# Patient Record
Sex: Male | Born: 1975 | Hispanic: Yes | Marital: Married | Smoking: Never smoker
Health system: Southern US, Community
[De-identification: ages and names within clinical notes are randomized; demographics above are authoritative.]

---

## 2014-09-23 ENCOUNTER — Emergency Department: Payer: Self-pay | Admitting: Emergency Medicine

## 2014-09-23 LAB — CBC
HCT: 48.1 % (ref 40.0–52.0)
HGB: 15.8 g/dL (ref 13.0–18.0)
MCH: 28.4 pg (ref 26.0–34.0)
MCHC: 32.8 g/dL (ref 32.0–36.0)
MCV: 87 fL (ref 80–100)
Platelet: 183 10*3/uL (ref 150–440)
RBC: 5.55 10*6/uL (ref 4.40–5.90)
RDW: 13.2 % (ref 11.5–14.5)
WBC: 11.8 10*3/uL — ABNORMAL HIGH (ref 3.8–10.6)

## 2014-09-23 LAB — COMPREHENSIVE METABOLIC PANEL
ALBUMIN: 4.1 g/dL (ref 3.4–5.0)
ALK PHOS: 63 U/L
AST: 41 U/L — AB (ref 15–37)
Anion Gap: 6 — ABNORMAL LOW (ref 7–16)
BILIRUBIN TOTAL: 0.7 mg/dL (ref 0.2–1.0)
BUN: 15 mg/dL (ref 7–18)
CHLORIDE: 105 mmol/L (ref 98–107)
Calcium, Total: 8.8 mg/dL (ref 8.5–10.1)
Co2: 28 mmol/L (ref 21–32)
Creatinine: 0.87 mg/dL (ref 0.60–1.30)
EGFR (African American): >60
EGFR (Non-African Amer.): >60
GLUCOSE: 144 mg/dL — AB (ref 65–99)
Osmolality: 281 (ref 275–301)
POTASSIUM: 3.9 mmol/L (ref 3.5–5.1)
SGPT (ALT): 35 U/L
SODIUM: 139 mmol/L (ref 136–145)
TOTAL PROTEIN: 8 g/dL (ref 6.4–8.2)

## 2014-09-23 LAB — URINALYSIS, COMPLETE
Bacteria: NONE SEEN
Bilirubin,UR: NEGATIVE
GLUCOSE, UR: NEGATIVE mg/dL (ref 0–75)
Ketone: NEGATIVE
LEUKOCYTE ESTERASE: NEGATIVE
NITRITE: NEGATIVE
PROTEIN: NEGATIVE
Ph: 6 (ref 4.5–8.0)
RBC,UR: 122 /HPF (ref 0–5)
Specific Gravity: 1.019 (ref 1.003–1.030)
Squamous Epithelial: NONE SEEN
WBC UR: 2 /HPF (ref 0–5)

## 2019-04-26 ENCOUNTER — Emergency Department: Payer: Self-pay

## 2019-04-26 ENCOUNTER — Emergency Department
Admission: EM | Admit: 2019-04-26 | Discharge: 2019-04-26 | Disposition: A | Discharge status: Home / Self Care | Payer: Self-pay | Attending: Emergency Medicine | Admitting: Emergency Medicine

## 2019-04-26 ENCOUNTER — Other Ambulatory Visit: Payer: Self-pay

## 2019-04-26 ENCOUNTER — Emergency Department: Admission: EM | Admit: 2019-04-26 | Discharge: 2019-04-26 | Discharge status: Absent without leave | Payer: Self-pay

## 2019-04-26 ENCOUNTER — Encounter: Payer: Self-pay | Admitting: Emergency Medicine

## 2019-04-26 DIAGNOSIS — T63311A Toxic effect of venom of black widow spider, accidental (unintentional), initial encounter: Secondary | ICD-10-CM | POA: Insufficient documentation

## 2019-04-26 DIAGNOSIS — M7918 Myalgia, other site: Secondary | ICD-10-CM | POA: Insufficient documentation

## 2019-04-26 LAB — BASIC METABOLIC PANEL
Anion gap: 13 (ref 5–15)
BUN: 15 mg/dL (ref 6–20)
CO2: 23 mmol/L (ref 22–32)
Calcium: 8.9 mg/dL (ref 8.9–10.3)
Chloride: 101 mmol/L (ref 98–111)
Creatinine, Ser: 0.94 mg/dL (ref 0.61–1.24)
GFR calc Af Amer: >60 mL/min (ref >60)
GFR calc non Af Amer: >60 mL/min (ref >60)
Glucose, Bld: 143 mg/dL — ABNORMAL HIGH (ref 70–99)
Potassium: 3.7 mmol/L (ref 3.5–5.1)
Sodium: 137 mmol/L (ref 135–145)

## 2019-04-26 LAB — CBC
HCT: 50.8 % (ref 39.0–52.0)
Hemoglobin: 17.9 g/dL — ABNORMAL HIGH (ref 13.0–17.0)
MCH: 29.3 pg (ref 26.0–34.0)
MCHC: 35.2 g/dL (ref 30.0–36.0)
MCV: 83.1 fL (ref 80.0–100.0)
Platelets: 241 10*3/uL (ref 150–400)
RBC: 6.11 MIL/uL — ABNORMAL HIGH (ref 4.22–5.81)
RDW: 12.9 % (ref 11.5–15.5)
WBC: 12.8 10*3/uL — ABNORMAL HIGH (ref 4.0–10.5)
nRBC: 0 % (ref 0.0–0.2)

## 2019-04-26 LAB — HEPATIC FUNCTION PANEL
ALT: 38 U/L (ref 0–44)
AST: 32 U/L (ref 15–41)
Albumin: 4.5 g/dL (ref 3.5–5.0)
Alkaline Phosphatase: 61 U/L (ref 38–126)
Bilirubin, Direct: <0.1 mg/dL (ref 0.0–0.2)
Total Bilirubin: 0.9 mg/dL (ref 0.3–1.2)
Total Protein: 8 g/dL (ref 6.5–8.1)

## 2019-04-26 LAB — CK: Total CK: 96 U/L (ref 49–397)

## 2019-04-26 LAB — TROPONIN I: Troponin I: <0.03 ng/mL (ref <0.03)

## 2019-04-26 MED ORDER — IBUPROFEN 200 MG PO TABS: 600.0000 mg | ORAL_TABLET | Freq: Four times a day (QID) | ORAL | 0 refills | Status: AC | PRN

## 2019-04-26 MED ORDER — ONDANSETRON 4 MG PO TBDP: 4.0000 mg | ORAL_TABLET | Freq: Three times a day (TID) | ORAL | 0 refills | Status: AC | PRN

## 2019-04-26 MED ORDER — KETOROLAC TROMETHAMINE 60 MG/2ML IM SOLN
15.0000 mg | Freq: Once | INTRAMUSCULAR | Status: AC
  Administered 2019-04-26: 21:00:00 15 mg via INTRAMUSCULAR
  Filled 2019-04-26: qty 2

## 2019-04-26 MED ORDER — ONDANSETRON 4 MG PO TBDP
8.0000 mg | ORAL_TABLET | Freq: Once | ORAL | Status: AC
  Administered 2019-04-26: 8 mg via ORAL
  Filled 2019-04-26: qty 2

## 2019-04-26 NOTE — ED Triage Notes (Addendum)
Hospital interpreter Marchelle Folks with pt; Pt says he was bitten on the back of his right leg by a black widow spider around noon today; pt shows this nurse the area just above his knee; no redness or swelling noted; pt reports some leg pain at this time; now says he's also having pain across the center of his chest and mid back; ambulatory with steady gait; pt adds he's vomited 2 times since the spider bite; denies muscle cramps

## 2019-04-26 NOTE — ED Notes (Signed)
Pt has went to medical imaging.

## 2019-04-26 NOTE — ED Notes (Signed)
Pt stated that he was bite by a spider earlier around noon and then around 1pm he started having pain in his leg, back, abd and across his chest. Pt also stated that he vomited twice earlier but he does feel better now.

## 2019-04-26 NOTE — ED Provider Notes (Signed)
[facility] Emergency Department Provider Note  ____________________________________________  Time seen: Approximately 9:59 PM  I have reviewed the triage vital signs and the nursing notes.   HISTORY  Chief Complaint Insect Bite; Chest Pain; and Back Pain  Encounter completed with Spanish interpreter at bedside  HPI Jesse Glass is a 43 y.o. male with no significant past medical history who reports being bitten by a black widow spider at about 12:00 PM today, and an hour later at 1:00 PM he started having pain in his right posterior leg, low back, abdomen, and diffusely across the chest.  Also reports nausea and vomiting earlier which has resolved.  Denies shortness of breath.  Pain is not radiating, no diaphoresis, no exertional symptoms or pleuritic symptoms.  Denies body aches fevers or chills.  Denies paresthesias or motor weakness.  Symptoms are constant, gradual onset, slowly improving in the last hour.  No aggravating or alleviating factors   Patient brings in a spider in a plastic bag, which is clearly a black widow.   History reviewed. No pertinent past medical history.   There are no active problems to display for this patient.    History reviewed. No pertinent surgical history.   Prior to Admission medications   Medication Sig Start Date End Date Taking? Authorizing Provider  ibuprofen (MOTRIN IB) 200 MG tablet Take 3 tablets (600 mg total) by mouth every 6 (six) hours as needed. 04/26/19   Sharman Cheek, MD  ondansetron (ZOFRAN ODT) 4 MG disintegrating tablet Take 1 tablet (4 mg total) by mouth every 8 (eight) hours as needed for nausea or vomiting. 04/26/19   Sharman Cheek, MD     Allergies Patient has no known allergies.   History reviewed. No pertinent family history.  Social History Social History   Tobacco Use  . Smoking status: Never Smoker  . Smokeless tobacco: Never Used  Substance Use Topics  . Alcohol use:  Never    Frequency: Never  . Drug use: Never    Review of Systems  Constitutional:   No fever or chills.  ENT:   No sore throat. No rhinorrhea. Cardiovascular:   Chest pain as above.  No syncope. Respiratory:   No dyspnea or cough. Gastrointestinal:   Positive as above for abdominal pain with vomiting Musculoskeletal: Musculoskeletal pain as above. All other systems reviewed and are negative except as documented above in ROS and HPI.  ____________________________________________   PHYSICAL EXAM:  VITAL SIGNS: ED Triage Vitals  Enc Vitals Group     BP 04/26/19 1909 (!) 142/83     Pulse Rate 04/26/19 1909 95     Resp 04/26/19 1909 16     Temp 04/26/19 1909 98.7 F (37.1 C)     Temp Source 04/26/19 1909 Oral     SpO2 04/26/19 1909 98 %     Weight 04/26/19 1911 187 lb 6.3 oz (85 kg)     Height --      Head Circumference --      Peak Flow --      Pain Score 04/26/19 1910 8     Pain Loc --      Pain Edu? --      Excl. in GC? --     Vital signs reviewed, nursing assessments reviewed.   Constitutional:   Alert and oriented. Non-toxic appearance. Eyes:   Conjunctivae are normal. EOMI. PERRL. ENT      Head:   Normocephalic and atraumatic.  Nose:   No congestion/rhinnorhea.       Mouth/Throat:   MMM, no pharyngeal erythema. No peritonsillar mass.       Neck:   No meningismus. Full ROM. Hematological/Lymphatic/Immunilogical:   No cervical lymphadenopathy. Cardiovascular:   RRR. Symmetric bilateral radial and DP pulses.  No murmurs. Cap refill less than 2 seconds. Respiratory:   Normal respiratory effort without tachypnea/retractions. Breath sounds are clear and equal bilaterally. No wheezes/rales/rhonchi. Gastrointestinal:   Soft and nontender. Non distended. There is no CVA tenderness.  No rebound, rigidity, or guarding.  Musculoskeletal:   Normal range of motion in all extremities. No joint effusions.  No lower extremity tenderness.  No edema.  No induration swelling  or inflammatory changes in the area of the right posterior distal thigh where the patient reports being bitten.  No rash or identifiable lesion. Neurologic:   Normal speech and language.  Motor grossly intact. No acute focal neurologic deficits are appreciated.  Skin:    Skin is warm, dry and intact. No rash noted.  No petechiae, purpura, or bullae.  ____________________________________________    LABS (pertinent positives/negatives) (all labs ordered are listed, but only abnormal results are displayed) Labs Reviewed  BASIC METABOLIC PANEL - Abnormal; Notable for the following components:      Result Value   Glucose, Bld 143 (*)    All other components within normal limits  CBC - Abnormal; Notable for the following components:   WBC 12.8 (*)    RBC 6.11 (*)    Hemoglobin 17.9 (*)    All other components within normal limits  TROPONIN I  HEPATIC FUNCTION PANEL  CK   ____________________________________________   EKG  Interpreted by me Normal sinus rhythm rate of 91, right axis, normal intervals.  Normal QRS ST segments and T waves.  No right heart strain, no ischemic changes.  ____________________________________________    RADIOLOGY  Dg Chest 2 View  Result Date: 04/26/2019 CLINICAL DATA:  Black widow bite.  Chest pain. EXAM: CHEST - 2 VIEW COMPARISON:  None. FINDINGS: There is elevation the right hemidiaphragm. No pneumothorax. The heart, hila, mediastinum, lungs, and pleura are otherwise unremarkable. IMPRESSION: No active cardiopulmonary disease. Electronically Signed   By: Gerome Sam III M.D   On: 04/26/2019 19:45    ____________________________________________   PROCEDURES Procedures  ____________________________________________    CLINICAL IMPRESSION / ASSESSMENT AND PLAN / ED COURSE  Medications ordered in the ED: Medications  ketorolac (TORADOL) injection 15 mg (15 mg Intramuscular Given 04/26/19 2043)  ondansetron (ZOFRAN-ODT) disintegrating  tablet 8 mg (8 mg Oral Given 04/26/19 2043)    Pertinent labs & imaging results that were available during my care of the patient were reviewed by me and considered in my medical decision making (see chart for details).  Jesse Glass was evaluated in Emergency Department on 04/26/2019 for the symptoms described in the history of present illness. He was evaluated in the context of the global COVID-19 pandemic, which necessitated consideration that the patient might be at risk for infection with the SARS-CoV-2 virus that causes COVID-19. Institutional protocols and algorithms that pertain to the evaluation of patients at risk for COVID-19 are in a state of rapid change based on information released by regulatory bodies including the CDC and federal and state organizations. These policies and algorithms were followed during the patient's care in the ED.   Patient presents with multiple musculoskeletal complaints that started an hour after being bitten by a black widow spider.  Vital signs  are unremarkable, not in distress, no neurologic symptoms.Considering the patient's symptoms, medical history, and physical examination today, I have low suspicion for ACS, PE, TAD, pneumothorax, carditis, mediastinitis, pneumonia, CHF, or sepsis.  Doubt AAA mesenteric ischemia or acute abdomen.  Terms are improving, labs are unremarkable.  Will treat with NSAIDs, recommend follow-up with primary care tomorrow.  Anticipate symptoms will be self-limited and resolve over the next 48 hours.        ____________________________________________   FINAL CLINICAL IMPRESSION(S) / ED DIAGNOSES    Final diagnoses:  Black widow spider bite, accidental or unintentional, initial encounter  Musculoskeletal pain     ED Discharge Orders         Ordered    ibuprofen (MOTRIN IB) 200 MG tablet  Every 6 hours PRN     04/26/19 2159    ondansetron (ZOFRAN ODT) 4 MG disintegrating tablet  Every 8 hours PRN     04/26/19  2159          Portions of this note were generated with dragon dictation software. Dictation errors may occur despite best attempts at proofreading.   Sharman CheekStafford, Larron Armor, MD 04/26/19 2204

## 2019-11-28 IMAGING — CR CHEST - 2 VIEW
2 series · 2 of 2 positions shown · non-contrast
Comparison: None.

CLINICAL DATA: Black widow bite.  Chest pain.

EXAM:
CHEST - 2 VIEW

[chest pa]
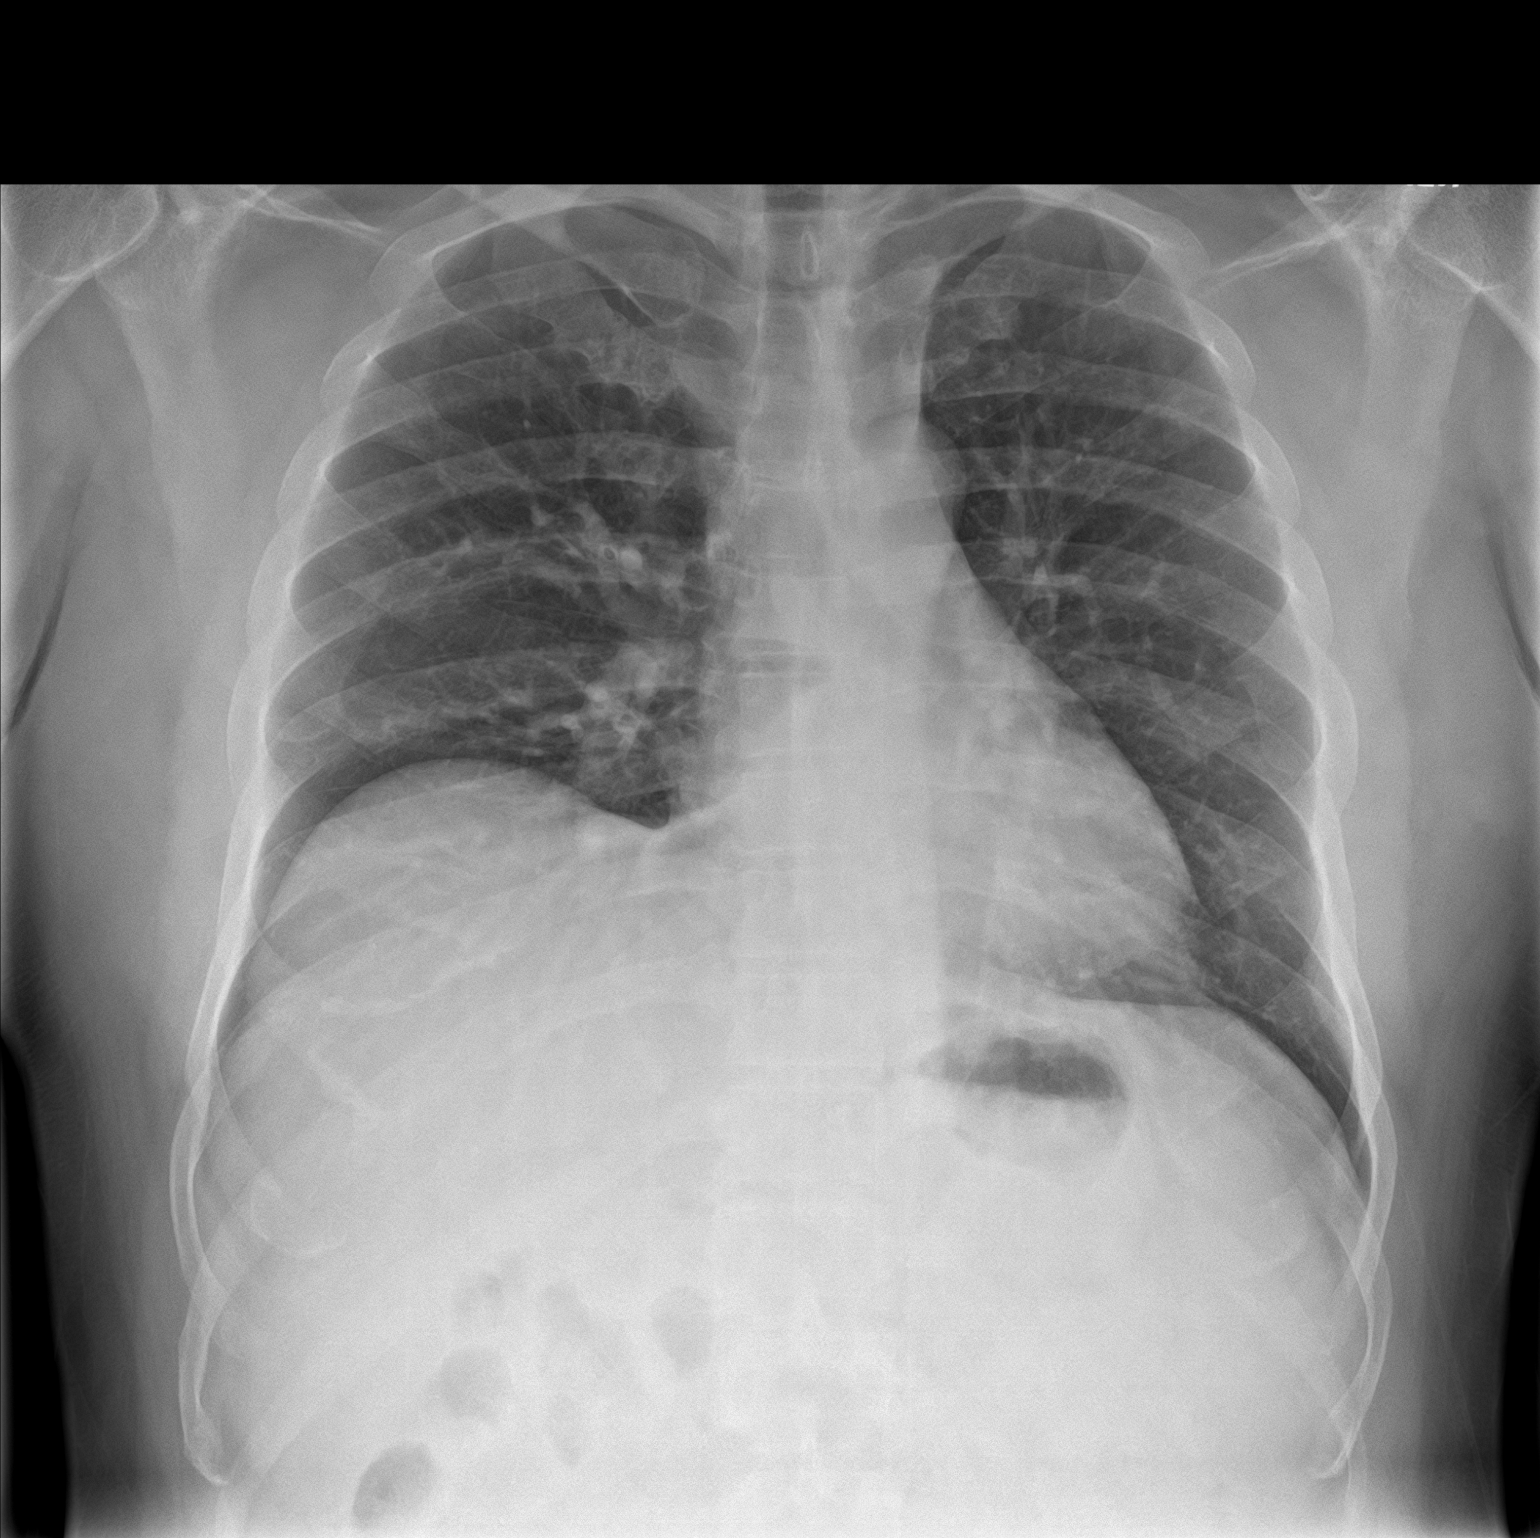

[chest lat]
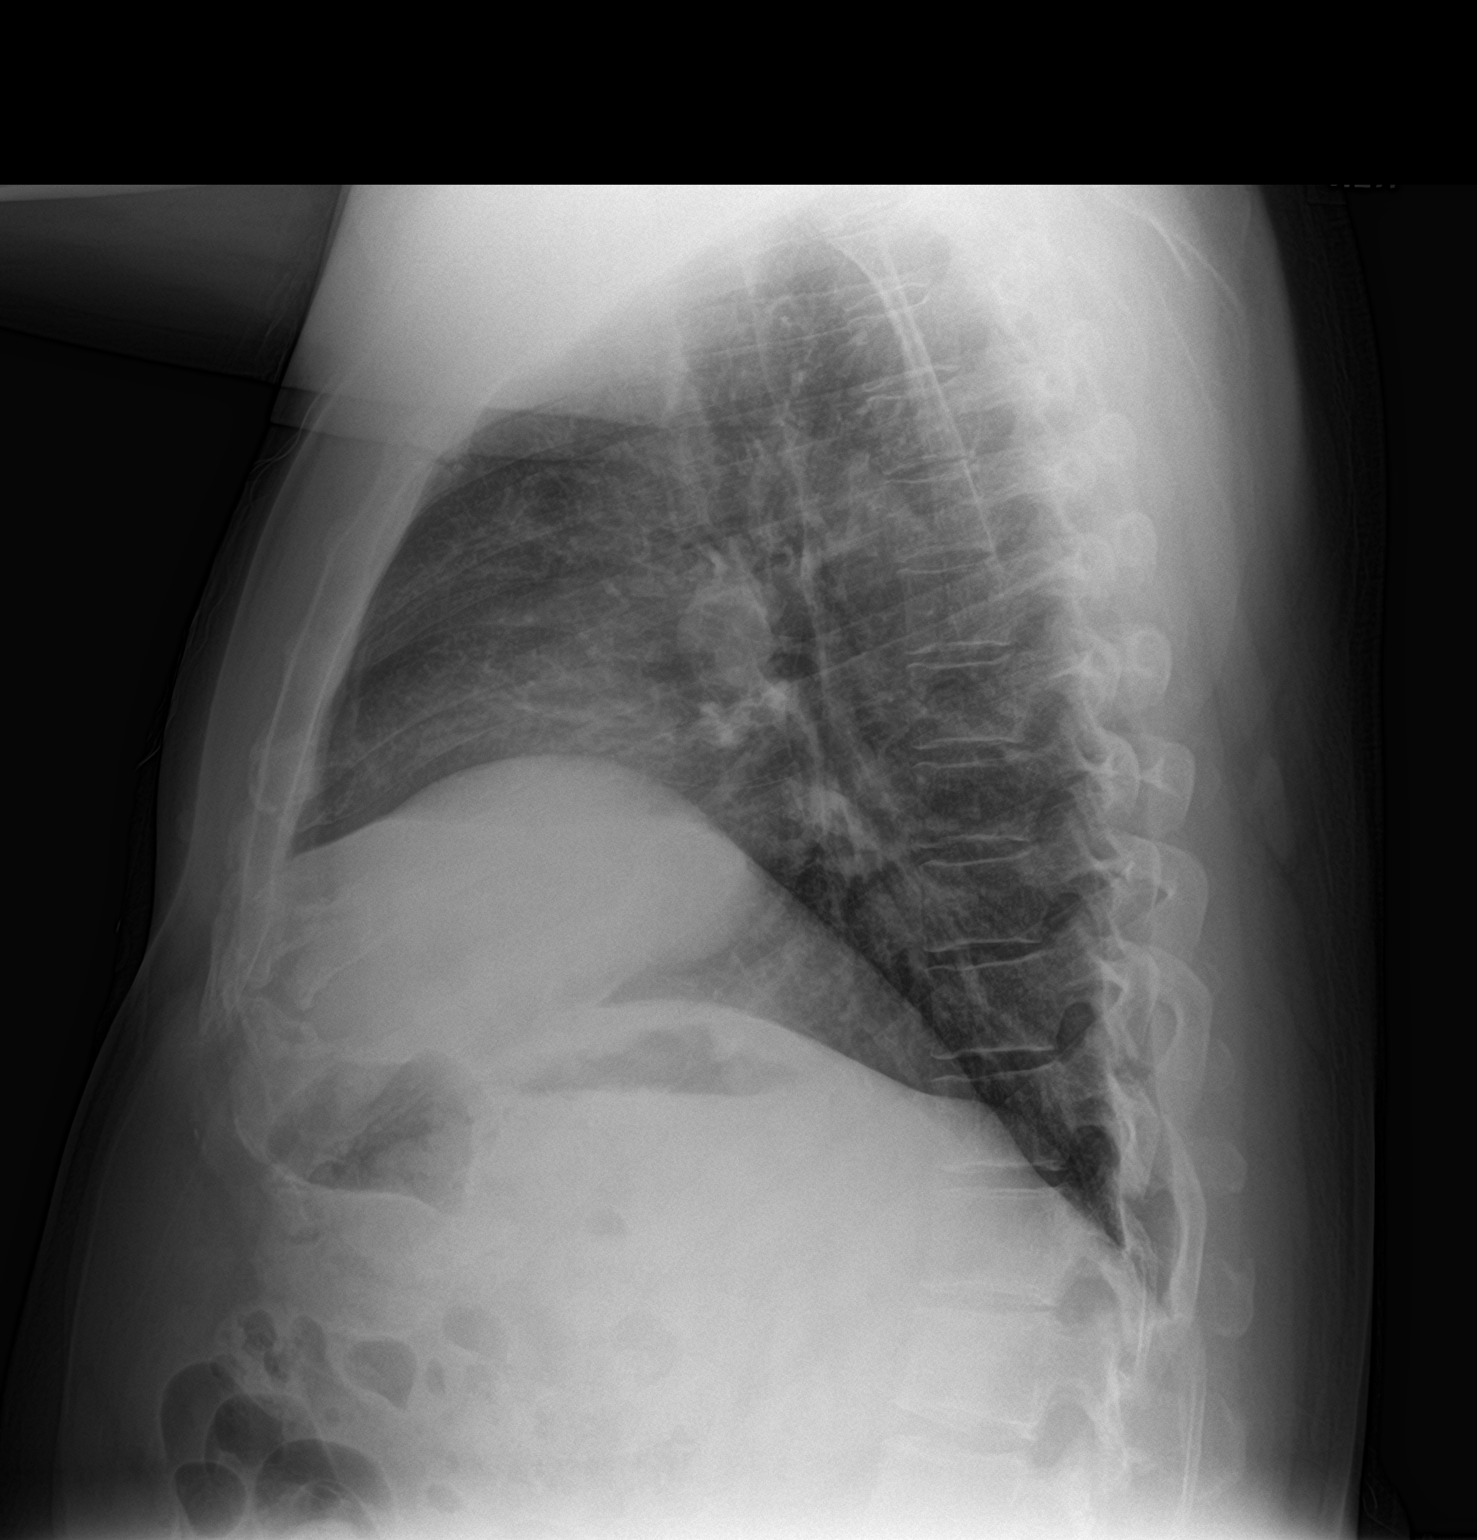

[2 of 2 positions shown; findings below may reference images not displayed]

FINDINGS: There is elevation the right hemidiaphragm. No pneumothorax. The
heart, hila, mediastinum, lungs, and pleura are otherwise
unremarkable.
IMPRESSION: No active cardiopulmonary disease.
